# Patient Record
Sex: Female | Born: 2000 | Race: Black or African American | Hispanic: No | Marital: Single | Smoking: Never smoker
Health system: Southern US, Community
[De-identification: ages and names within clinical notes are randomized; demographics above are authoritative.]

---
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
## 2019-05-14 ENCOUNTER — Ambulatory Visit: Payer: Self-pay | Attending: Internal Medicine

## 2019-05-14 DIAGNOSIS — Z23 Encounter for immunization: Secondary | ICD-10-CM

## 2019-05-14 NOTE — Progress Notes (Signed)
   Covid-19 Vaccination Clinic  Name:  Laterrica Libman    MRN: [id number] DOB: [birth year]  05/14/2019  Ms. Glace was observed post Covid-19 immunization for 15 minutes without incident. She was provided with Vaccine Information Sheet and instruction to access the V-Safe system.   Ms. Rautio was instructed to call 911 with any severe reactions post vaccine: Marland Kitchen Difficulty breathing  . Swelling of face and throat  . A fast heartbeat  . A bad rash all over body  . Dizziness and weakness   Immunizations Administered    Name Date Dose VIS Date Route   Pfizer COVID-19 Vaccine 05/14/2019  1:17 PM 0.3 mL 02/06/2019 Intramuscular   Manufacturer: [employer], [employer]   Lot: [id number]   NDC: [address]-0

## 2019-06-08 ENCOUNTER — Ambulatory Visit: Payer: Self-pay | Attending: Internal Medicine

## 2019-06-08 DIAGNOSIS — Z23 Encounter for immunization: Secondary | ICD-10-CM

## 2019-06-08 NOTE — Progress Notes (Signed)
   Covid-19 Vaccination Clinic  Name:  Joanne Cox    MRN: [id number] DOB: 08/25/2000  06/08/2019  Joanne Cox was observed post Covid-19 immunization for 15 minutes without incident. She was provided with Vaccine Information Sheet and instruction to access the V-Safe system.   Joanne Cox was instructed to call 911 with any severe reactions post vaccine: Marland Kitchen Difficulty breathing  . Swelling of face and throat  . A fast heartbeat  . A bad rash all over body  . Dizziness and weakness   Immunizations Administered    Name Date Dose VIS Date Route   Pfizer COVID-19 Vaccine 06/08/2019  2:14 PM 0.3 mL 02/06/2019 Intramuscular   Manufacturer: [employer], [employer]   Lot: [id number]   NDC: [address]-5

## 2022-06-08 ENCOUNTER — Other Ambulatory Visit (HOSPITAL_BASED_OUTPATIENT_CLINIC_OR_DEPARTMENT_OTHER): Payer: Self-pay

## 2022-06-08 MED ORDER — SAXENDA 18 MG/3ML ~~LOC~~ SOPN
PEN_INJECTOR | SUBCUTANEOUS | 0 refills | Status: AC
  Filled 2022-06-08: qty 15, 30d supply, fill #0

## 2022-06-11 ENCOUNTER — Other Ambulatory Visit (HOSPITAL_BASED_OUTPATIENT_CLINIC_OR_DEPARTMENT_OTHER): Payer: Self-pay

## 2022-06-18 ENCOUNTER — Other Ambulatory Visit (HOSPITAL_BASED_OUTPATIENT_CLINIC_OR_DEPARTMENT_OTHER): Payer: Self-pay
